# Patient Record
Sex: Female | Born: 2000 | Race: White | Hispanic: No | Marital: Single | Smoking: Current every day smoker
Health system: Southern US, Community
[De-identification: ages and names within clinical notes are randomized; demographics above are authoritative.]

## PROBLEM LIST (undated history)

## (undated) DIAGNOSIS — L700 Acne vulgaris: Secondary | ICD-10-CM

## (undated) DIAGNOSIS — A749 Chlamydial infection, unspecified: Secondary | ICD-10-CM

## (undated) DIAGNOSIS — G43109 Migraine with aura, not intractable, without status migrainosus: Secondary | ICD-10-CM

## (undated) DIAGNOSIS — S92009A Unspecified fracture of unspecified calcaneus, initial encounter for closed fracture: Secondary | ICD-10-CM

## (undated) DIAGNOSIS — S82899A Other fracture of unspecified lower leg, initial encounter for closed fracture: Secondary | ICD-10-CM

## (undated) DIAGNOSIS — N939 Abnormal uterine and vaginal bleeding, unspecified: Secondary | ICD-10-CM

## (undated) DIAGNOSIS — F32A Depression, unspecified: Secondary | ICD-10-CM

## (undated) DIAGNOSIS — A64 Unspecified sexually transmitted disease: Secondary | ICD-10-CM

## (undated) DIAGNOSIS — F329 Major depressive disorder, single episode, unspecified: Secondary | ICD-10-CM

## (undated) DIAGNOSIS — S060X9A Concussion with loss of consciousness of unspecified duration, initial encounter: Secondary | ICD-10-CM

## (undated) DIAGNOSIS — T50901A Poisoning by unspecified drugs, medicaments and biological substances, accidental (unintentional), initial encounter: Secondary | ICD-10-CM

## (undated) DIAGNOSIS — Z8639 Personal history of other endocrine, nutritional and metabolic disease: Secondary | ICD-10-CM

## (undated) DIAGNOSIS — F419 Anxiety disorder, unspecified: Secondary | ICD-10-CM

## (undated) HISTORY — DX: Other fracture of unspecified lower leg, initial encounter for closed fracture: S82.899A

## (undated) HISTORY — DX: Major depressive disorder, single episode, unspecified: F32.9

## (undated) HISTORY — PX: WISDOM TOOTH EXTRACTION: SHX21

## (undated) HISTORY — DX: Acne vulgaris: L70.0

## (undated) HISTORY — DX: Concussion with loss of consciousness of unspecified duration, initial encounter: S06.0X9A

## (undated) HISTORY — DX: Anxiety disorder, unspecified: F41.9

## (undated) HISTORY — DX: Personal history of other endocrine, nutritional and metabolic disease: Z86.39

## (undated) HISTORY — DX: Depression, unspecified: F32.A

## (undated) HISTORY — PX: MOUTH SURGERY: SHX715

## (undated) HISTORY — DX: Chlamydial infection, unspecified: A74.9

## (undated) HISTORY — DX: Migraine with aura, not intractable, without status migrainosus: G43.109

## (undated) HISTORY — DX: Unspecified sexually transmitted disease: A64

## (undated) HISTORY — PX: TYMPANOSTOMY TUBE PLACEMENT: SHX32

## (undated) HISTORY — DX: Abnormal uterine and vaginal bleeding, unspecified: N93.9

## (undated) HISTORY — DX: Unspecified fracture of unspecified calcaneus, initial encounter for closed fracture: S92.009A

## (undated) HISTORY — DX: Poisoning by unspecified drugs, medicaments and biological substances, accidental (unintentional), initial encounter: T50.901A

---
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
## 2021-02-06 ENCOUNTER — Ambulatory Visit: Payer: BC Managed Care – PPO | Admitting: Obstetrics and Gynecology

## 2021-03-27 ENCOUNTER — Ambulatory Visit: Payer: BC Managed Care – PPO | Admitting: Obstetrics and Gynecology

## 2021-07-06 NOTE — Progress Notes (Signed)
20 y.o. G0P0000 Single White or Caucasian Not Hispanic or Latino female here for annual exam.  She has a nexplanon, placed on 08/15/19. She would like STD testing. H/O chlamydia and PID. Sexually active, same partner x 5 months. Not using condoms. No dyspareunia.  Period Cycle (Days): 28 Period Duration (Days): 5-7 Period Pattern: Regular Menstrual Flow: Moderate Menstrual Control: Tampon Menstrual Control Change Freq (Hours): 2-3 Dysmenorrhea: (!) Moderate Dysmenorrhea Symptoms: Cramping  Patient's last menstrual period was 06/30/2021.          Sexually active: Yes.    The current method of family planning is Nexplanon.    Exercising: No.  The patient does not participate in regular exercise at present. Smoker:  yes vaping   Health Maintenance: Pap:  never  History of abnormal Pap:  n/a MMG:  never  BMD:   never  Colonoscopy: none  TDaP:  03/01/13  Gardasil: complete    reports that she has quit smoking. Her smoking use included cigarettes. She has never used smokeless tobacco. She reports current alcohol use. She reports current drug use. Drug: Marijuana. Rare ETOH. Sophomore at [employer], early childhood development.   Past Medical History:  Diagnosis Date   Abnormal uterine bleeding    Acne vulgaris    completed course of accutane (Dr. Sharyn Lull)   Ankle fracture 2010`   right   Anxiety    Chlamydia 06/2018   treated while at [facility]   Concussion 12/15/2017   Depression    Heel fracture 10/2012   left (epiphyseal stress reaction vs fracture (Dr. Charlett Blake)   History of vitamin D deficiency 07/03/2018   Migraine with aura    Overdose 06/2018   of naproxen, impulsive behavior.  Hospitalized at [facility]   STD (sexually transmitted disease)    chlamydia 2020 treated    Past Surgical History:  Procedure Laterality Date   MOUTH SURGERY     tooth extractions   TYMPANOSTOMY TUBE PLACEMENT      Current Outpatient Medications  Medication Sig Dispense Refill    acetaminophen (TYLENOL) 500 MG tablet Take 1,000 mg by mouth every 6 (six) hours as needed.     FLUoxetine (PROZAC) 20 MG capsule Take 20 mg by mouth daily.     hydrOXYzine (ATARAX) 10 MG tablet Take 10-20 mg by mouth at bedtime as needed.     ibuprofen (ADVIL) 800 MG tablet Take 1 tablet (800 mg total) by mouth every 8 (eight) hours as needed. 30 tablet 1   No current facility-administered medications for this visit.    Family History  Problem Relation Age of Onset   Migraines Mother    ADD / ADHD Father    Migraines Sister    Breast cancer Maternal Aunt 91   Breast cancer Maternal Grandmother    Diabetes Paternal Grandfather     Review of Systems  All other systems reviewed and are negative.  Exam:   BP 116/60    Pulse 67    Ht 5\' 4"  (1.626 m)    Wt 150 lb (68 kg)    LMP 06/30/2021    SpO2 99%    BMI 25.75 kg/m   Weight change: @WEIGHTCHANGE @ Height:   Height: 5\' 4"  (162.6 cm)  Ht Readings from Last 3 Encounters:  07/09/21 5\' 4"  (1.626 m) (46 %, Z= -0.11)*  01/29/20 5' 5.5" (1.664 m) (69 %, Z= 0.50)*  01/11/20 5\' 5"  (1.651 m) (62 %, Z= 0.31)*   * Growth percentiles are based on CDC (Girls,  2-20 Years) data.    General appearance: alert, cooperative and appears stated age Head: Normocephalic, without obvious abnormality, atraumatic Neck: no adenopathy, supple, symmetrical, trachea midline and thyroid normal to inspection and palpation Lungs: clear to auscultation bilaterally Cardiovascular: regular rate and rhythm Breasts: normal appearance, no masses or tenderness Abdomen: soft, non-tender; non distended,  no masses,  no organomegaly Extremities: extremities normal, atraumatic, no cyanosis or edema Skin: Skin color, texture, turgor normal. No rashes or lesions Lymph nodes: Cervical, supraclavicular, and axillary nodes normal. No abnormal inguinal nodes palpated Neurologic: Grossly normal   Pelvic: External genitalia:  no lesions              Urethra:  normal appearing  urethra with no masses, tenderness or lesions              Bartholins and Skenes: normal                 Vagina: normal appearing vagina with normal color and discharge, no lesions              Cervix: no lesions               Bimanual Exam:  Uterus:  normal size, contour, position, consistency, mobility, non-tender              Adnexa: no mass, fullness, tenderness               Rectovaginal: Confirms               Anus:  normal sphincter tone, no lesions  [name] chaperoned for the exam.  1. Well woman exam Discussed breast self exam Discussed calcium and vit D intake  2. Screening examination for STD (sexually transmitted disease) - RPR - HIV Antibody (routine testing w rflx) - Hepatitis C antibody - SURESWAB CT/NG/T. vaginalis  3. Encounter for surveillance of implantable subdermal contraceptive Condom use encouraged Due for removal in 1/24

## 2021-07-09 ENCOUNTER — Ambulatory Visit (INDEPENDENT_AMBULATORY_CARE_PROVIDER_SITE_OTHER): Payer: BC Managed Care – PPO | Admitting: Obstetrics and Gynecology

## 2021-07-09 ENCOUNTER — Other Ambulatory Visit: Payer: Self-pay

## 2021-07-09 ENCOUNTER — Encounter: Payer: Self-pay | Admitting: Obstetrics and Gynecology

## 2021-07-09 VITALS — BP 116/60 | HR 67 | Ht 64.0 in | Wt 150.0 lb

## 2021-07-09 DIAGNOSIS — Z113 Encounter for screening for infections with a predominantly sexual mode of transmission: Secondary | ICD-10-CM | POA: Diagnosis not present

## 2021-07-09 DIAGNOSIS — Z01419 Encounter for gynecological examination (general) (routine) without abnormal findings: Secondary | ICD-10-CM

## 2021-07-09 DIAGNOSIS — Z3046 Encounter for surveillance of implantable subdermal contraceptive: Secondary | ICD-10-CM | POA: Diagnosis not present

## 2021-07-09 NOTE — Patient Instructions (Signed)

## 2021-07-10 LAB — HEPATITIS C ANTIBODY
Hepatitis C Ab: NONREACTIVE
SIGNAL TO CUT-OFF: 0.05 (ref <1.00)

## 2021-07-10 LAB — HIV ANTIBODY (ROUTINE TESTING W REFLEX): HIV 1&2 Ab, 4th Generation: NONREACTIVE

## 2021-07-10 LAB — RPR: RPR Ser Ql: NONREACTIVE

## 2021-07-10 LAB — SURESWAB CT/NG/T. VAGINALIS
C. trachomatis RNA, TMA: NOT DETECTED
N. gonorrhoeae RNA, TMA: NOT DETECTED
Trichomonas vaginalis RNA: NOT DETECTED

## 2022-03-24 ENCOUNTER — Encounter: Payer: Self-pay | Admitting: Internal Medicine

## 2022-04-27 ENCOUNTER — Encounter: Payer: Self-pay | Admitting: Internal Medicine

## 2022-06-28 NOTE — Progress Notes (Deleted)
21 y.o. G0P0000 Single White or Caucasian Not Hispanic or Latino female here for annual exam.      No LMP recorded. Patient has had an implant.          Sexually active: {yes no:314532}  The current method of family planning is {contraception:315051}.    Exercising: {yes no:314532}  {types:19826} Smoker:  {YES NO:22349}  Health Maintenance: Pap:  never  History of abnormal Pap:  n/a MMG:  never  BMD:   never  Colonoscopy: none  TDaP:  03/01/13  Gardasil: complete    reports that she has quit smoking. Her smoking use included cigarettes. She has never used smokeless tobacco. She reports current alcohol use. She reports current drug use. Drug: Marijuana.  Past Medical History:  Diagnosis Date   Abnormal uterine bleeding    Acne vulgaris    completed course of accutane (Dr. Sharyn Lull)   Ankle fracture 2010`   right   Anxiety    Chlamydia 06/2018   treated while at [facility]   Concussion 12/15/2017   Depression    Heel fracture 10/2012   left (epiphyseal stress reaction vs fracture (Dr. Charlett Blake)   History of vitamin D deficiency 07/03/2018   Migraine with aura    Overdose 06/2018   of naproxen, impulsive behavior.  Hospitalized at [facility]   STD (sexually transmitted disease)    chlamydia 2020 treated    Past Surgical History:  Procedure Laterality Date   MOUTH SURGERY     tooth extractions   TYMPANOSTOMY TUBE PLACEMENT      Current Outpatient Medications  Medication Sig Dispense Refill   acetaminophen (TYLENOL) 500 MG tablet Take 1,000 mg by mouth every 6 (six) hours as needed.     FLUoxetine (PROZAC) 20 MG capsule Take 20 mg by mouth daily.     hydrOXYzine (ATARAX) 10 MG tablet Take 10-20 mg by mouth at bedtime as needed.     ibuprofen (ADVIL) 800 MG tablet Take 1 tablet (800 mg total) by mouth every 8 (eight) hours as needed. 30 tablet 1   No current facility-administered medications for this visit.    Family History  Problem Relation Age of Onset    Migraines Mother    ADD / ADHD Father    Migraines Sister    Breast cancer Maternal Aunt 67   Breast cancer Maternal Grandmother    Diabetes Paternal Grandfather     Review of Systems  Exam:   There were no vitals taken for this visit.  Weight change: @WEIGHTCHANGE @ Height:      Ht Readings from Last 3 Encounters:  07/09/21 5\' 4"  (1.626 m) (46 %, Z= -0.11)*  01/29/20 5' 5.5" (1.664 m) (69 %, Z= 0.50)*  01/11/20 5\' 5"  (1.651 m) (62 %, Z= 0.31)*   * Growth percentiles are based on CDC (Girls, 2-20 Years) data.    General appearance: alert, cooperative and appears stated age Head: Normocephalic, without obvious abnormality, atraumatic Neck: no adenopathy, supple, symmetrical, trachea midline and thyroid {CHL AMB PHY EX THYROID NORM [phone]::"normal to inspection and palpation"} Lungs: clear to auscultation bilaterally Cardiovascular: regular rate and rhythm Breasts: {Exam; breast:13139::"normal appearance, no masses or tenderness"} Abdomen: soft, non-tender; non distended,  no masses,  no organomegaly Extremities: extremities normal, atraumatic, no cyanosis or edema Skin: Skin color, texture, turgor normal. No rashes or lesions Lymph nodes: Cervical, supraclavicular, and axillary nodes normal. No abnormal inguinal nodes palpated Neurologic: Grossly normal   Pelvic: External genitalia:  no lesions  Urethra:  normal appearing urethra with no masses, tenderness or lesions              Bartholins and Skenes: normal                 Vagina: normal appearing vagina with normal color and discharge, no lesions              Cervix: {CHL AMB PHY EX CERVIX NORM DEFAULT:[phone]::"no lesions"}               Bimanual Exam:  Uterus:  {CHL AMB PHY EX UTERUS NORM [phone]::"normal size, contour, position, consistency, mobility, non-tender"}              Adnexa: {CHL AMB PHY EX ADNEXA NO MASS DEFAULT:[phone]::"no mass, fullness, tenderness"}                Rectovaginal: Confirms               Anus:  normal sphincter tone, no lesions  *** chaperoned for the exam.  A:  Well Woman with normal exam  P:

## 2022-06-30 ENCOUNTER — Ambulatory Visit (INDEPENDENT_AMBULATORY_CARE_PROVIDER_SITE_OTHER): Payer: BC Managed Care – PPO | Admitting: Obstetrics and Gynecology

## 2022-06-30 ENCOUNTER — Encounter: Payer: Self-pay | Admitting: Obstetrics and Gynecology

## 2022-06-30 VITALS — BP 104/80 | HR 78 | Ht 65.0 in | Wt 166.0 lb

## 2022-06-30 DIAGNOSIS — Z3046 Encounter for surveillance of implantable subdermal contraceptive: Secondary | ICD-10-CM

## 2022-06-30 DIAGNOSIS — Z3009 Encounter for other general counseling and advice on contraception: Secondary | ICD-10-CM

## 2022-06-30 DIAGNOSIS — Z113 Encounter for screening for infections with a predominantly sexual mode of transmission: Secondary | ICD-10-CM | POA: Diagnosis not present

## 2022-06-30 NOTE — Progress Notes (Signed)
GYNECOLOGY  VISIT   HPI: 21 y.o.   Single White or Caucasian Not Hispanic or Latino  female   G0P0000 with No LMP recorded. Patient has had an implant.   here for she wants to discuss option for birth control since her Nexplanon will run out. Her nexplanon was placed in 1/27.  Prior to contraception she had severe dysmenorrhea. She previously had PID with a mriena IUD in on 2 separate occasions.   H/O migraine with aura.   Patient would like STD testing today. Currently sexually active. Together x 1.5 years. Not using condoms. No dyspareunia.   GYNECOLOGIC HISTORY: No LMP recorded. Patient has had an implant. Contraception:nexplanon  Menopausal hormone therapy: n/a        OB History     Gravida  0   Para  0   Term  0   Preterm  0   AB  0   Living  0      SAB  0   IAB  0   Ectopic  0   Multiple  0   Live Births  0              Patient Active Problem List   Diagnosis Date Noted   Hypertrophy of nasal turbinates 01/25/2019   Deviated nasal septum 01/25/2019   Migraine with aura    Tonsillar enlargement 09/01/2018   Tachycardia 09/01/2018   Flu-like symptoms 09/01/2018   Sore throat 09/01/2018   Episodic tension-type headache, not intractable 08/28/2018   Snoring 08/28/2018   History of chlamydia 07/20/2018   History of vitamin D deficiency 07/03/2018   MDD (major depressive disorder), recurrent severe, without psychosis (HCC) 06/28/2018   Cannabis use disorder, mild, abuse 06/28/2018   Migraine with aura and without status migrainosus, not intractable 04/21/2017   Ingrown left greater toenail 02/26/2016    Past Medical History:  Diagnosis Date   Abnormal uterine bleeding    Acne vulgaris    completed course of accutane (Dr. Sharyn Lull)   Ankle fracture 2010`   right   Anxiety    [facility]lamydia 06/2018   [facility]   Concussion 12/15/2017   Depression    Heel fracture 10/2012   left (epiphyseal stress reaction vs fracture  (Dr. Charlett Blake)   History of vitamin D deficiency 07/03/2018   Migraine with aura    Overdose 06/2018   of naproxen, impulsive behavior.  Hospitalized at [facility]   STD (sexually transmitted disease)    chlamydia 2020 treated    Past Surgical History:  Procedure Laterality Date   MOUTH SURGERY     tooth extractions   TYMPANOSTOMY TUBE PLACEMENT      Current Outpatient Medications  Medication Sig Dispense Refill   acetaminophen (TYLENOL) 500 MG tablet Take 1,000 mg by mouth every 6 (six) hours as needed.     FLUoxetine (PROZAC) 20 MG capsule Take 20 mg by mouth daily.     hydrOXYzine (ATARAX) 10 MG tablet Take 10-20 mg by mouth at bedtime as needed.     ibuprofen (ADVIL) 800 MG tablet Take 1 tablet (800 mg total) by mouth every 8 (eight) hours as needed. 30 tablet 1   No current facility-administered medications for this visit.     ALLERGIES: Patient has no known allergies.  Family History  Problem Relation Age of Onset   Migraines Mother    ADD / ADHD Father    Migraines Sister    Breast cancer Maternal Aunt 31  Breast cancer Maternal Grandmother    Diabetes Paternal Grandfather     Social History   Socioeconomic History   Marital status: Single    Spouse name: Not on file   Number of children: Not on file   Years of education: Not on file   Highest education level: Not on file  Occupational History   Occupation: student  Tobacco Use   Smoking status: Former    Types: Cigarettes   Smokeless tobacco: Never  Vaping Use   Vaping Use: Some days   Substances: Nicotine  Substance and Sexual Activity   Alcohol use: Yes    Comment: rarely   Drug use: Yes    Types: Marijuana    Comment: "couple times a week"   Sexual activity: Yes    Partners: Male    Birth control/protection: I.U.D.  Other Topics Concern   Not on file  Social History Narrative   Morgan Rios is a 11th grade student.   She attends [employer].   She lives with both parents. She has one sister.   She  enjoys eating, babysitting, and hanging with friends.   She works at [employer].   Social Determinants of Corporate investment banker Strain: Not on [employer] Insecurity: Not on file  Transportation Needs: Not on file  Physical Activity: Not on file  Stress: Not on file  Social Connections: Not on file  Intimate Partner Violence: Not on file    Review of Systems  All other systems reviewed and are negative.   PHYSICAL EXAMINATION:    There were no vitals taken for this visit.    General appearance: alert, cooperative and appears stated age Nexplanon easily palpated in her left upper arm.   Pelvic: External genitalia:  no lesions              Urethra:  normal appearing urethra with no masses, tenderness or lesions              Bartholins and Skenes: normal                 Vagina: normal appearing vagina with normal color and discharge, no lesions              Cervix: no lesions              Chaperone was present for exam.  1. General counseling and advice on female contraception She would like to have another nexplanon. She will return for an annual exam and nexplanon exchange next month - Removal of implanon rod; Future - Insertion of implanon rod; Future  2. Screening examination for STD (sexually transmitted disease) Declines blood work. - SURESWAB CT/NG/T. vaginalis

## 2022-07-01 LAB — SURESWAB CT/NG/T. VAGINALIS
C. trachomatis RNA, TMA: NOT DETECTED
N. gonorrhoeae RNA, TMA: NOT DETECTED
Trichomonas vaginalis RNA: NOT DETECTED

## 2022-07-07 ENCOUNTER — Ambulatory Visit: Payer: BC Managed Care – PPO | Admitting: Obstetrics and Gynecology

## 2022-07-27 ENCOUNTER — Encounter: Payer: Self-pay | Admitting: Internal Medicine

## 2022-08-10 NOTE — Progress Notes (Signed)
22 y.o. G0P0000 Single White or Caucasian Not Hispanic or Latino female here for annual exam and nexplanon exchange. She is having monthly cycles with the nexplanon x 3-4 days. She saturates a regular tampon in 4 hours.    Prior to contraception she had severe dysmenorrhea. She previously had PID with a mriena IUD in on 2 separate occasions.    H/O migraine with aura.   She just had STD testing last month. She is with the same long term partner x 1.5 year.   Patient's last menstrual period was 07/21/2022 (approximate).          Sexually active: Yes.    The current method of family planning is nexplanon .    Exercising: No.  The patient has a physically strenuous job, but has no regular exercise apart from work.  Smoker:  no  Health Maintenance: Pap:  never  History of abnormal Pap:  n/a MMG:  n/a BMD:   n/a Colonoscopy: n/a TDaP:  03/01/13 Gardasil: complete   reports that she has quit smoking. Her smoking use included cigarettes. She has never used smokeless tobacco. She reports current alcohol use. She reports current drug use. Drug: Marijuana. Junior at [employer], early childhood development.    Past Medical History:  Diagnosis Date   Abnormal uterine bleeding    Acne vulgaris    completed course of accutane (Dr. Renda Rolls)   Ankle fracture 2010`   right   Anxiety    Chlamydia 06/2018   treated while at [place] 12/15/2017   Depression    Heel fracture 10/2012   left (epiphyseal stress reaction vs fracture (Dr. Lynann Bologna)   History of vitamin D deficiency 07/03/2018   Migraine with aura    Overdose 06/2018   of naproxen, impulsive behavior.  Hospitalized at [facility]   STD (sexually transmitted disease)    chlamydia 2020 treated    Past Surgical History:  Procedure Laterality Date   MOUTH SURGERY     tooth extractions   TYMPANOSTOMY TUBE PLACEMENT      Current Outpatient Medications  Medication Sig Dispense Refill   acetaminophen (TYLENOL) 500 MG  tablet Take 1,000 mg by mouth every 6 (six) hours as needed.     FLUoxetine (PROZAC) 20 MG capsule Take 20 mg by mouth daily.     hydrOXYzine (ATARAX) 10 MG tablet Take 25 mg by mouth at bedtime as needed.     ibuprofen (ADVIL) 800 MG tablet Take 1 tablet (800 mg total) by mouth every 8 (eight) hours as needed. 30 tablet 1   No current facility-administered medications for this visit.    Family History  Problem Relation Age of Onset   Migraines Mother    ADD / ADHD Father    Migraines Sister    Breast cancer Maternal Aunt 74   Breast cancer Maternal Grandmother    Diabetes Paternal Grandfather     Review of Systems  All other systems reviewed and are negative.   Exam:   BP 110/70   Pulse 66   Ht 5\' 5"  (1.651 m)   Wt 166 lb (75.3 kg)   LMP 07/21/2022 (Approximate)   SpO2 100%   BMI 27.62 kg/m   Weight change: @WEIGHTCHANGE @ Height:   Height: 5\' 5"  (165.1 cm)  Ht Readings from Last 3 Encounters:  08/13/22 5\' 5"  (1.651 m)  06/30/22 5\' 5"  (1.651 m)  07/09/21 5\' 4"  (1.626 m) (46 %, Z= -0.11)*   * Growth percentiles are based on  CDC (Girls, 2-20 Years) data.    General appearance: alert, cooperative and appears stated age Head: Normocephalic, without obvious abnormality, atraumatic Neck: no adenopathy, supple, symmetrical, trachea midline and thyroid normal to inspection and palpation Lungs: clear to auscultation bilaterally Cardiovascular: regular rate and rhythm Breasts: normal appearance, no masses or tenderness Abdomen: soft, non-tender; non distended,  no masses,  no organomegaly Extremities: extremities normal, atraumatic, no cyanosis or edema Skin: Skin color, texture, turgor normal. No rashes or lesions Lymph nodes: Cervical, supraclavicular, and axillary nodes normal. No abnormal inguinal nodes palpated Neurologic: Grossly normal Pelvic: deferred  Risks of nexplanon removal and reinsertion were reviewed with the patient and a consent was signed.  The patient  was placed in the supine position with her left arm bent at the elbow. The area was cleansed with betadine and injected with 1% lidocaine. A #11 blade was used to incise over the distal end of the nexplanon implant. The implant was grasped with a clamp and removed.   More local anesthesia was placed along the path where the new nexplanon would be placed. The nexplanon device was inserted in the usual fashion without difficulty. Slight oozing from the insertion site was stopped with pressure. The device was palpated in place.  The patients arm was cleansed of betadine and a steri strip was placed over the incision. A gauze was wrapped around her arm.  She tolerated the procedure well  Instructions for care were discussed.   1. Well woman exam Discussed breast self exam Discussed calcium and vit D intake No pap until she is 22 Negative STD testing last month  2. Encounter for removal and reinsertion of Nexplanon Done Routine f/u

## 2022-08-13 ENCOUNTER — Encounter: Payer: Self-pay | Admitting: Obstetrics and Gynecology

## 2022-08-13 ENCOUNTER — Ambulatory Visit (INDEPENDENT_AMBULATORY_CARE_PROVIDER_SITE_OTHER): Payer: BC Managed Care – PPO | Admitting: Obstetrics and Gynecology

## 2022-08-13 VITALS — BP 110/70 | HR 66 | Ht 65.0 in | Wt 166.0 lb

## 2022-08-13 DIAGNOSIS — Z01419 Encounter for gynecological examination (general) (routine) without abnormal findings: Secondary | ICD-10-CM | POA: Diagnosis not present

## 2022-08-13 DIAGNOSIS — Z3046 Encounter for surveillance of implantable subdermal contraceptive: Secondary | ICD-10-CM

## 2022-08-13 NOTE — Patient Instructions (Signed)

## 2022-08-26 ENCOUNTER — Encounter: Payer: Self-pay | Admitting: *Deleted

## 2023-10-03 ENCOUNTER — Ambulatory Visit: Payer: BC Managed Care – PPO | Admitting: Obstetrics and Gynecology

## 2023-10-07 ENCOUNTER — Ambulatory Visit (INDEPENDENT_AMBULATORY_CARE_PROVIDER_SITE_OTHER): Payer: Self-pay | Admitting: Radiology

## 2023-10-07 ENCOUNTER — Encounter: Payer: Self-pay | Admitting: Radiology

## 2023-10-07 ENCOUNTER — Other Ambulatory Visit (HOSPITAL_COMMUNITY)
Admission: RE | Admit: 2023-10-07 | Discharge: 2023-10-07 | Disposition: A | Discharge status: Home / Self Care | Source: Ambulatory Visit | Attending: Radiology | Admitting: Radiology

## 2023-10-07 VITALS — BP 112/74 | HR 117 | Ht 66.5 in | Wt 149.6 lb

## 2023-10-07 DIAGNOSIS — Z1331 Encounter for screening for depression: Secondary | ICD-10-CM | POA: Diagnosis not present

## 2023-10-07 DIAGNOSIS — Z113 Encounter for screening for infections with a predominantly sexual mode of transmission: Secondary | ICD-10-CM

## 2023-10-07 DIAGNOSIS — Z01419 Encounter for gynecological examination (general) (routine) without abnormal findings: Secondary | ICD-10-CM

## 2023-10-07 DIAGNOSIS — Z975 Presence of (intrauterine) contraceptive device: Secondary | ICD-10-CM

## 2023-10-07 NOTE — Progress Notes (Signed)
 Morgan Rios Sep 22, 2000 [id number]   History:  23 y.o. G0 presents for annual exam. Would like STI screening today. Nexplanon inserted 07/2022  Gynecologic History Patient's last menstrual period was 09/15/2023 (approximate). Period Cycle (Days): 28 Period Duration (Days): 5 Period Pattern: Regular Menstrual Flow: Moderate Menstrual Control:  (menstrual disc) Dysmenorrhea: (!) Mild Dysmenorrhea Symptoms: Cramping Contraception/Family planning: Nexplanon Sexually active: yes Last Pap: never   Obstetric History OB History  Gravida Para Term Preterm AB Living  0 0 0 0 0 0  SAB IAB Ectopic Multiple Live Births  0 0 0 0 0       10/07/2023    8:04 AM 05/23/2019    3:17 PM 05/23/2018    3:50 PM  Depression screen PHQ 2/9  Decreased Interest 1 1 1   Down, Depressed, Hopeless 1 1   PHQ - 2 Score 2 2 1   Altered sleeping 1  3  Tired, decreased energy 2  3  Change in appetite 1  3  Feeling bad or failure about yourself  1  0  Trouble concentrating 1  3  Moving slowly or fidgety/restless 0  1  Suicidal thoughts 0  0  PHQ-9 Score 8  14  Difficult doing work/chores Somewhat difficult  Extremely dIfficult     The following portions of the patient's history were reviewed and updated as appropriate: allergies, current medications, past family history, past medical history, past social history, past surgical history, and problem list.  Review of Systems  All other systems reviewed and are negative.   Past medical history, past surgical history, family history and social history were all reviewed and documented in the EPIC chart.  Exam:  Vitals:   10/07/23 0801  BP: 112/74  Pulse: (!) 117  SpO2: 96%  Weight: 149 lb 9.6 oz (67.9 kg)  Height: 5' 6.5" (1.689 m)   Body mass index is 23.78 kg/m.  Physical Exam Vitals and nursing note reviewed. Exam conducted with a chaperone present.  Constitutional:      Appearance: Normal appearance. She is normal weight.  HENT:      Head: Normocephalic and atraumatic.  Neck:     Thyroid: No thyroid mass, thyromegaly or thyroid tenderness.  Cardiovascular:     Rate and Rhythm: Regular rhythm.     Heart sounds: Normal heart sounds.  Pulmonary:     Effort: Pulmonary effort is normal.     Breath sounds: Normal breath sounds.  Chest:  Breasts:    Breasts are symmetrical.     Right: Normal. No inverted nipple, mass, nipple discharge, skin change or tenderness.     Left: Normal. No inverted nipple, mass, nipple discharge, skin change or tenderness.  Abdominal:     General: Abdomen is flat. Bowel sounds are normal.     Palpations: Abdomen is soft.  Genitourinary:    General: Normal vulva.     Vagina: Normal. No vaginal discharge, bleeding or lesions.     Cervix: Normal. No discharge or lesion.     Uterus: Normal. Not enlarged and not tender.      Adnexa: Right adnexa normal and left adnexa normal.       Right: No mass, tenderness or fullness.         Left: No mass, tenderness or fullness.    Lymphadenopathy:     Upper Body:     Right upper body: No axillary adenopathy.     Left upper body: No axillary adenopathy.  Skin:    General: Skin  is warm and dry.  Neurological:     Mental Status: She is alert and oriented to person, place, and time.  Psychiatric:        Mood and Affect: Mood normal.        Thought Content: Thought content normal.        Judgment: Judgment normal.      Raynelle Fanning, CMA present for exam  Assessment/Plan:   1. Well woman exam with routine gynecological exam (Primary) - Cytology - PAP( [place])  2. Screening for STDs (sexually transmitted diseases) - HIV antibody (with reflex) - RPR - Hepatitis C antibody - Cytology - PAP( [place])  3. Nexplanon in place Due for replacement 07/2025    Discussed SBE, pap and STI screening as directed/appropriate. Recommend of exercise weekly, including weight bearing exercise. Encouraged the use of seatbelts and  sunscreen.  Return in about 1 year (around 10/06/2024) for Annual.  Tanda Rockers WHNP-BC 8:24 AM 10/07/2023

## 2023-10-07 NOTE — Patient Instructions (Signed)

## 2023-10-10 LAB — CYTOLOGY - PAP
Chlamydia: NEGATIVE
Comment: NEGATIVE
Comment: NEGATIVE
Comment: NORMAL
Diagnosis: NEGATIVE
High risk HPV: POSITIVE — AB
Neisseria Gonorrhea: NEGATIVE

## 2023-10-11 NOTE — Telephone Encounter (Signed)
 Spoke w/ the pt and all questions were answered and she was appreciative of the call back.   Will forward to provider for final review as an FYI. Encounter closed.

## 2023-10-13 ENCOUNTER — Telehealth: Payer: Self-pay

## 2023-10-13 NOTE — Telephone Encounter (Signed)
 Pt's mother (Amy-on DPR) LVM in triage line inquiring about pt's recent test results. 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 Reported that the pt did complete gardasil series. Reports pt having same sexual partner x3 years exclusively. Also reported the pt having a few STIs as a teenager and inquiring if those would make HPV any more concerning?   All questions answered, pt's mother reassured. She reported that the pt is really torn up about it since she just recently broke up with boyfriend and is very emotional. She will provide her with information learned and hopefully reassure the pt as well. She voiced appreciation for the cb and the education. Encounter closed.

## 2024-02-07 ENCOUNTER — Encounter: Payer: Self-pay | Admitting: Radiology

## 2024-02-07 ENCOUNTER — Ambulatory Visit: Admitting: Radiology

## 2024-02-07 VITALS — BP 114/80 | HR 114 | Wt 155.6 lb

## 2024-02-07 DIAGNOSIS — L739 Follicular disorder, unspecified: Secondary | ICD-10-CM

## 2024-02-07 NOTE — Progress Notes (Signed)
      Subjective: Morgan Rios is a 23 y.o. female who complains of a tender lump left labia x's 1 week (noticed after shaving).     Review of Systems  All other systems reviewed and are negative.   Past Medical History:  Diagnosis Date   Abnormal uterine bleeding    Acne vulgaris    completed course of accutane (Dr. Tricia)   Ankle fracture 2010`   right   Anxiety    Chlamydia 06/2018   treated while at [facility]   Concussion 12/15/2017   Depression    Heel fracture 10/2012   left (epiphyseal stress reaction vs fracture (Dr. Gaspar)   History of vitamin D  deficiency 07/03/2018   Migraine with aura    Overdose 06/2018   of naproxen , impulsive behavior.  Hospitalized at [facility]   STD (sexually transmitted disease)    chlamydia 2020 treated      Objective:  Today's Vitals   02/07/24 1111  BP: 114/80  Pulse: (!) 114  SpO2: 97%  Weight: 155 lb 9.6 oz (70.6 kg)   Body mass index is 24.74 kg/m.   Physical Exam Vitals and nursing note reviewed. Exam conducted with a chaperone present.  Constitutional:      Appearance: Normal appearance. She is well-developed.  Pulmonary:     Effort: Pulmonary effort is normal.  Abdominal:     General: Abdomen is flat.     Palpations: Abdomen is soft.  Genitourinary:    General: Normal vulva.     Vagina: No vaginal discharge, erythema, bleeding or lesions.     Cervix: Normal. No discharge, friability, lesion or erythema.     Uterus: Normal.      Adnexa: Right adnexa normal and left adnexa normal.      Comments: 2mm slightly inflamed folliculitis present left groin Neurological:     Mental Status: She is alert.  Psychiatric:        Mood and Affect: Mood normal.        Thought Content: Thought content normal.        Judgment: Judgment normal.     [name], CMA present for exam  Assessment:/Plan:  1. Folliculitis (Primary) Mild folliculitis, appears to be resolving on its own May use hot compresses and  removed the hair with clean tweezers Reassured no signs of infection, just inflammation.   [name], NP 11:23 AM

## 2024-10-10 ENCOUNTER — Ambulatory Visit: Admitting: Obstetrics and Gynecology
